# Patient Record
Sex: Male | Born: 2000 | Race: White | Hispanic: No | Marital: Single | Smoking: Never smoker
Health system: Southern US, Community
[De-identification: ages and names within clinical notes are randomized; demographics above are authoritative.]

## PROBLEM LIST (undated history)

## (undated) DIAGNOSIS — T7840XA Allergy, unspecified, initial encounter: Secondary | ICD-10-CM

## (undated) DIAGNOSIS — Z8619 Personal history of other infectious and parasitic diseases: Secondary | ICD-10-CM

## (undated) DIAGNOSIS — J45909 Unspecified asthma, uncomplicated: Secondary | ICD-10-CM

## (undated) HISTORY — DX: Personal history of other infectious and parasitic diseases: Z86.19

## (undated) HISTORY — DX: Unspecified asthma, uncomplicated: J45.909

## (undated) HISTORY — DX: Allergy, unspecified, initial encounter: T78.40XA

---
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
## 2018-05-18 ENCOUNTER — Emergency Department (HOSPITAL_BASED_OUTPATIENT_CLINIC_OR_DEPARTMENT_OTHER)
Admission: EM | Admit: 2018-05-18 | Discharge: 2018-05-18 | Disposition: A | Discharge status: Home / Self Care | Payer: BC Managed Care – PPO | Attending: Emergency Medicine | Admitting: Emergency Medicine

## 2018-05-18 ENCOUNTER — Ambulatory Visit: Payer: Self-pay

## 2018-05-18 ENCOUNTER — Other Ambulatory Visit: Payer: Self-pay

## 2018-05-18 ENCOUNTER — Encounter (HOSPITAL_BASED_OUTPATIENT_CLINIC_OR_DEPARTMENT_OTHER): Payer: Self-pay | Admitting: Emergency Medicine

## 2018-05-18 DIAGNOSIS — R55 Syncope and collapse: Secondary | ICD-10-CM | POA: Insufficient documentation

## 2018-05-18 DIAGNOSIS — Z79899 Other long term (current) drug therapy: Secondary | ICD-10-CM | POA: Diagnosis not present

## 2018-05-18 DIAGNOSIS — J45909 Unspecified asthma, uncomplicated: Secondary | ICD-10-CM | POA: Diagnosis not present

## 2018-05-18 LAB — CBG MONITORING, ED: Glucose-Capillary: 105 mg/dL — ABNORMAL HIGH (ref 70–99)

## 2018-05-18 NOTE — ED Triage Notes (Addendum)
Patient reports syncopal event after taking a shower this morning.  Reports that he had stepped out of the shower and then became dizzy and lightheaded as well as nauseated.  States that he sat down on the edge of the tub and the next thing he remembers is waking up in the tub.  Denies head injury however he does endorse mild headache.  Reports feeling better after eating and drinking this morning.  Mother reports she believes this was just related to not drinking enough over the weekend but PCP told patient to come to ER to be evaluated.

## 2018-05-18 NOTE — Discharge Instructions (Signed)
You were seen in the emergency department after passing out, otherwise known as a syncope episode.  Your EKG was normal.  Your blood sugar was normal as well.  Discussed this is possibly due to being dehydrated.  We would like you to get plenty of water and eat throughout the day today.  Be sure to rest.  We would like you to follow-up with your primary care provider in 5 to 7 days for reevaluation.  Return to the ER anytime for new or worsening symptoms including but not limited to passing out again, chest pain, trouble breathing, headache, neck pain, back pain, numbness, weakness, or any other concerns that you may have.

## 2018-05-18 NOTE — ED Provider Notes (Signed)
[facility] EMERGENCY DEPARTMENT Provider Note   CSN: [id number] Arrival date & time: 05/18/18  1011     History   Chief Complaint Chief Complaint  Patient presents with  . Loss of Consciousness    HPI [name] is a 17 y.o. male with a hx of asthma and previous syncopal episodes who presents to the ED with his mother and father s/p syncopal episode which occurred shortly prior to arrival this AM.  Patient states that he was away for the weekend doing a lot of hiking, not drinking enough water, and staying in an un-airconditioned facility, returned home last night.  He states this morning he woke up feeling okay, had not had anything to eat or drink, and took a hot shower.  He states that after stepping out of the shower he began to feel lightheaded, dizzy, and nauseous as if he may pass out.  He states that he sat down in the edge of the tub and subsequently passed out.  He believes he was only out for a very brief period of time.  He states that he woke up in the bathtub and does not think that he hit his head.  He felt fairly with it when he woke up, reports he was able to ambulate and get something to eat and drink and feel much better.  He did have a mild headache initially upon arrival to the ER, however upon my evaluation he states this is resolved.  He has no complaints at this time, he states he feels his normal self, he and family at bedside feel that this was likely due to dehydration primarily came in to the ER because PCP recommended it.  He had a similar episode a couple of years ago when he had mono and was dehydrated, he states that he feels much better after this syncopal episode in comparison to last time.  Other than eating and drinking after the incident no specific alleviating or aggravating factors.  Denies fever, chills, vomiting, diarrhea, abdominal pain, chest pain, shortness of breath, or palpitations.  Denies tea colored urine.  Denies family history of  sudden cardiac death. Denies leg pain/swelling, hemoptysis, recent surgery/trauma, recent long travel, hormone use, personal hx of cancer, or hx of DVT/PE.    HPI  Past Medical History:  Diagnosis Date  . Allergy   . Asthma   . History of chicken pox     Patient Active Problem List   Diagnosis Date Noted  . Allergic rhinitis 03/17/2017  . Syncope 12/18/2015    History reviewed. No pertinent surgical history.      Home Medications    Prior to Admission medications   Medication Sig Start Date End Date Taking? Authorizing Provider  cetirizine (ZYRTEC) 10 MG tablet Take 10 mg by mouth daily.    [provider]  fluticasone (FLONASE) 50 MCG/ACT nasal spray Place 2 sprays into both nostrils daily. Patient not taking: Reported on 03/17/2017 09/02/16   Sheliah Hatch, MD  predniSONE (DELTASONE) 10 MG tablet 3 tabs x3 days and then 2 tabs x3 days and then 1 tab x3 days.  Take w/ food. 03/17/17   Sheliah Hatch, MD    Family History Family History  Problem Relation Age of Onset  . Diabetes Father     Social History Social History   Tobacco Use  . Smoking status: Never Smoker  . Smokeless tobacco: Never Used  Substance Use Topics  . Alcohol use: No  Alcohol/week: 0.0 oz  . Drug use: No     Allergies   Patient has no known allergies.   Review of Systems Review of Systems  Constitutional: Negative for chills and fever.  Eyes: Negative for visual disturbance.  Respiratory: Negative for shortness of breath.   Cardiovascular: Negative for chest pain, palpitations and leg swelling.  Gastrointestinal: Positive for nausea (resolved at present). Negative for abdominal pain and vomiting.  Genitourinary: Negative for decreased urine volume, difficulty urinating, dysuria and urgency.       Negative for tea colored urine  Neurological: Positive for dizziness (resolved at present), syncope, light-headedness (resolved at present) and headaches (resolved at  present). Negative for weakness and numbness.  All other systems reviewed and are negative.    Physical Exam Updated Vital Signs BP 115/73 (BP Location: Right Arm)   Pulse 60   Temp 98.7 F (37.1 C) (Oral)   Resp 14   Ht 5\' 11"  (1.803 m)   Wt 72.6 kg (160 lb)   SpO2 100%   BMI 22.32 kg/m   Physical Exam  Constitutional: He appears well-developed and well-nourished.  Non-toxic appearance. No distress.  HENT:  Head: Normocephalic and atraumatic. Head is without raccoon's eyes and without Battle's sign.  Right Ear: Tympanic membrane normal. No hemotympanum.  Left Ear: Tympanic membrane normal. No hemotympanum.  Nose: Nose normal.  Mouth/Throat: Uvula is midline and oropharynx is clear and moist.  Eyes: Pupils are equal, round, and reactive to light. Conjunctivae and EOM are normal. Right eye exhibits no discharge. Left eye exhibits no discharge.  Neck: Normal range of motion. Neck supple. No spinous process tenderness present. No neck rigidity.  Cardiovascular: Normal rate, regular rhythm and intact distal pulses.  No murmur heard. Pulmonary/Chest: Breath sounds normal. No respiratory distress. He has no wheezes. He has no rales.  Abdominal: Soft. He exhibits no distension. There is no tenderness.  Musculoskeletal: He exhibits no edema or tenderness.  No obvious deformities, appreciable swelling, erythema, ecchymosis, or areas of warmth. Upper extremities: Normal range of motion, nontender Back: No midline tenderness Lower extremities: Normal range of motion.  Nontender.  Neurological:  Alert. Clear speech. No facial droop. CNIII-XII grossly intact. Bilateral upper and lower extremities' sensation grossly intact. 5/5 symmetric strength with grip strength and with plantar and dorsi flexion bilaterally. . Normal finger to nose bilaterally. Negative pronator drift. Negative Romberg sign. Gait is steady and intact.   Skin: Skin is warm and dry. No rash noted.  Psychiatric: He has a  normal mood and affect. His behavior is normal.  Nursing note and vitals reviewed.    ED Treatments / Results  Labs (all labs ordered are listed, but only abnormal results are displayed) Labs Reviewed  CBG MONITORING, ED - Abnormal; Notable for the following components:      Result Value   Glucose-Capillary 105 (*)    All other components within normal limits    EKG EKG Interpretation  Date/Time:  Monday May 18 2018 11:24:00 EDT Ventricular Rate:  75 PR Interval:    QRS Duration: 101 QT Interval:  392 QTC Calculation: 438 R Axis:   86 Text Interpretation:  Sinus rhythm Baseline wander in lead(s) V5 Confirmed by Tilden Fossaees, Elizabeth [id number]) on 05/18/2018 11:44:27 AM   Radiology No results found.  Procedures Procedures (including critical care time)  Medications Ordered in ED Medications - No data to display   Initial Impression / Assessment and Plan / ED Course  I have reviewed the triage vital signs  and the nursing notes.  Pertinent labs & imaging results that were available during my care of the patient were reviewed by me and considered in my medical decision making (see chart for details).   Patient presents to the emergency department status post syncopal episode feeling at baseline upon my evaluation with his parents at bedside.  Patient nontoxic-appearing, in no apparent distress, vitals WNL.  Exam is grossly unremarkable, no focal neurologic deficits or focal midline tenderness, no signs of serious head, neck, or back injury.  EKG with normal sinus rhythm, on cardiac monitor patient has not had any arrhythmias or tachycardia.  CBG 105.  Patient is a young otherwise healthy 17 year old male, he does not have history of heart disease, no family history of sudden cardiac death, patient is low risk.  EKG without ischemia to raise concern for ACS. Patient is PERC negative, doubt pulmonary embolism.  Urine has not been tea colored, doubt rhabdomyolysis.  Discussed option of  evaluating further with blood work including electrolytes and hemoglobin, patient and family do not feel this is necessary, I am in agreement based on history and physical exam as well as history of similar a few years ago.  Patient ambulatory without sxs in the ED, hemodynamically stable and safe for discharge. Discussed need for good hydration, eating normally throughout the day, and rest with primary care follow-up. I discussed results, treatment plan, need for PCP follow-up, and return precautions with the patient and his parents at bedside. Provided opportunity for questions, patient and his parents confirmed understanding and are in agreement with plan.   Findings and plan of care discussed with supervising physician Dr. Madilyn Hook- in agreement with plan.   Vitals:   05/18/18 1027 05/18/18 1235  BP: 115/73 124/71  Pulse: 60 88  Resp: 14 18  Temp: 98.7 F (37.1 C)   SpO2: 100% 99%   Final Clinical Impressions(s) / ED Diagnoses   Final diagnoses:  Syncope, unspecified syncope type    ED Discharge Orders    None       Maxwell Lozano 05/18/18 Derinda Sis, MD 05/19/18 [phone]

## 2018-05-18 NOTE — Telephone Encounter (Signed)
Pt's. mother called; reported the pt. fainted in the shower.  Stated he is awake, alert, and responsive at this time.  She was alerted by him, per cell phone, that he needed her help.  The pt. Able to answer Triage assessment questions.  Alert and oriented x 3.  Denied any injury with falling in shower.  Reported just prior to shower, he noticed a bruise on his penis, looked at it, and then got into the shower.  Denied any ill feeling or lightheaded feeling initially.  Stated when he was ready to get out of shower, he noticed that his vision got dark and he saw the room spinning.  He woke up on the bathtub floor.  Stated he doesn't think he was unconscious for longer than a few minutes; stated < 5 min.  His mother reported he called on his cell phone for help.  Pt. Denied any heart palpitations, chest pain, SOB, weakness of extremities. Denied hitting head.  Reported he has a slight headache at this time, and some residual lightheaded feeling that worsens with movement.  Stated his vision is clear at this time.  Mother reported skin warm/ dry.  Blood sugar checked; fasting BS 105. (reported pt. Not diabetic; checked with father's glucose monitor)  Denied any abdominal pain, nausea or vomiting.  Recommended to be seen in ER; [facility] Dr. Beverely Lowabori is out of office.  FC agreed pt. will need to be eval. In the ER.  Advised pt. and mother of recommendation to go to ER now.  Verb. Understanding.          Reason for Disposition . [1] All other patients AND [2] now alert and feels fine(Exception: SIMPLE FAINT due to stress, pain, prolonged standing, or suddenly standing)  Answer Assessment - Initial Assessment Questions 1. ONSET: "How long were you unconscious?" (minutes) "When did it happen?"     Unknown; mother  2. CONTENT: "What happened during period of unconsciousness?" (e.g., seizure activity)     Felt lightheaded just after showered 3. MENTAL STATUS: "Alert and oriented now?" (oriented  x 3 = name, month, location)      Alert and oriented at present  4. TRIGGER: "What do you think caused the fainting?" "What were you doing just before you fainted?"  (e.g., exercise, sudden standing up, prolonged standing)     unknown 5. RECURRENT SYMPTOM: "Have you ever passed out before?" If so, ask: "When was the last time?" and "What happened that time?"      About 2 years ago after diag. With Mono 6. INJURY: "Did you sustain any injury during the fall?"     Denied injury 7. CARDIAC SYMPTOMS: "Have you had any of the following symptoms: chest pain, difficulty breathing, palpitations?"    Denied the above 8. NEUROLOGIC SYMPTOMS: "Have you had any of the following symptoms: headache, numbness, vertigo, weakness?"     Light headed; denies any visual sx's. ; described previous episode of room spinning 9. GI SYMPTOMS: "Have you had any of the following symptoms: abdominal pain, vomiting, diarrhea, blood in stools?"     Denied the above  10. OTHER SYMPTOMS: "Do you have any other symptoms?"       Denied fever/ chills; c/o headache; was out in the heat over weekend  Protocols used: [facility]

## 2018-05-18 NOTE — Telephone Encounter (Signed)
Reviewed in PCP absence. Correct disposition given. Will keep an eye out for ER summary.

## 2018-05-19 ENCOUNTER — Telehealth: Payer: Self-pay | Admitting: General Practice

## 2018-05-19 ENCOUNTER — Encounter: Payer: Self-pay | Admitting: General Practice

## 2018-05-19 NOTE — Telephone Encounter (Signed)
Report ran by office showed this patient is due for their Meningitis B (Bexsero ) vaccine. Please advise if ok to schedule patient a nurse visit for this vaccination?  

## 2018-05-20 NOTE — Telephone Encounter (Signed)
Reviewing in PCP absence. No contraindications to Bexsero. Is recommended. Ok to contact patient and schedule if he is willing.

## 2018-05-20 NOTE — Telephone Encounter (Signed)
Letter mailed to pt to inform of Bexsero need.  

## 2019-01-04 ENCOUNTER — Other Ambulatory Visit: Payer: Self-pay

## 2019-01-04 ENCOUNTER — Ambulatory Visit: Payer: BC Managed Care – PPO | Admitting: Family Medicine

## 2019-01-04 ENCOUNTER — Encounter: Payer: Self-pay | Admitting: Family Medicine

## 2019-01-04 VITALS — BP 126/78 | HR 80 | Temp 98.1°F | Resp 16 | Ht 71.0 in | Wt 164.0 lb

## 2019-01-04 DIAGNOSIS — H109 Unspecified conjunctivitis: Secondary | ICD-10-CM | POA: Diagnosis not present

## 2019-01-04 MED ORDER — OFLOXACIN 0.3 % OP SOLN: 1.0000 [drp] | Freq: Four times a day (QID) | OPHTHALMIC | 0 refills | Status: DC

## 2019-01-04 NOTE — Patient Instructions (Signed)
Please follow up if symptoms do not improve or as needed.   Bacterial Conjunctivitis, Adult Bacterial conjunctivitis is an infection of your conjunctiva. This is the clear membrane that covers the white part of your eye and the inner part of your eyelid. This infection can make your eye:  Red or pink.  Itchy. This condition spreads easily from person to person (is contagious) and from one eye to the other eye. What are the causes?  This condition is caused by germs (bacteria). You may get the infection if you come into close contact with: ? A person who has the infection. ? Items that have germs on them (are contaminated), such as face towels, contact lens solution, or eye makeup. What increases the risk? You are more likely to get this condition if you:  Have contact with people who have the infection.  Wear contact lenses.  Have a sinus infection.  Have had a recent eye injury or surgery.  Have a weak body defense system (immune system).  Have dry eyes. What are the signs or symptoms?   Thick, yellowish discharge from the eye.  Tearing or watery eyes.  Itchy eyes.  Burning feeling in your eyes.  Eye redness.  Swollen eyelids.  Blurred vision. How is this treated?   Antibiotic eye drops or ointment.  Antibiotic medicine taken by mouth. This is used for infections that do not get better with drops or ointment or that last more than 10 days.  Cool, wet cloths placed on the eyes.  Artificial tears used 2-6 times a day. Follow these instructions at home: Medicines  Take or apply your antibiotic medicine as told by your doctor. Do not stop taking or applying the antibiotic even if you start to feel better.  Take or apply over-the-counter and prescription medicines only as told by your doctor.  Do not touch your eyelid with the eye-drop bottle or the ointment tube. Managing discomfort  Wipe any fluid from your eye with a warm, wet washcloth or a cotton  ball.  Place a clean, cool, wet cloth on your eye. Do this for 10-20 minutes, 3-4 times per day. General instructions  Do not wear contacts until the infection is gone. Wear glasses until your doctor says it is okay to wear contacts again.  Do not wear eye makeup until the infection is gone. Throw away old eye makeup.  Change or wash your pillowcase every day.  Do not share towels or washcloths.  Wash your hands often with soap and water. Use paper towels to dry your hands.  Do not touch or rub your eyes.  Do not drive or use heavy machinery if your vision is blurred. Contact a doctor if:  You have a fever.  You do not get better after 10 days. Get help right away if:  You have a fever and your symptoms get worse all of a sudden.  You have very bad pain when you move your eye.  Your face: ? Hurts. ? Is red. ? Is swollen.  You have sudden loss of vision. Summary  Bacterial conjunctivitis is an infection of your conjunctiva.  This infection spreads easily from person to person.  Wash your hands often with soap and water. Use paper towels to dry your hands.  Take or apply your antibiotic medicine as told by your doctor.  Contact a doctor if you have a fever or you do not get better after 10 days. This information is not intended to replace advice  given to you by your health care provider. Make sure you discuss any questions you have with your health care provider. Document Released: 07/23/2008 Document Revised: 05/20/2018 Document Reviewed: 05/20/2018 Elsevier Interactive Patient Education  2019 [employer].

## 2019-01-04 NOTE — Progress Notes (Signed)
   Subjective  CC:  Chief Complaint  Patient presents with  . Conjunctivitis    Left eye, started 01/01/18.. Red and draining   Same day acute visit; PCP not available. New pt to me. Chart reviewed.   HPI: Maxwell Lozano is a 18 y.o. male who presents to the office today to address the problems listed above in the chief complaint.  Healthy 18 year old male noticed some left eye irritative symptoms on Saturday night.  Awoke with red sore eye crusted closed.  Since, increasing redness.  No photophobia.  No right eye symptoms.  No associated URI.  No vision changes.  Has had positive exposure from friends with similar illness. Assessment  1. Bacterial conjunctivitis of left eye      Plan   Bacterial conjunctivitis: Antibiotic drops.  Education and prevention of spread discussed.  See after visit summary.  Follow up: Follow-up if unimproved   No orders of the defined types were placed in this encounter.  Meds ordered this encounter  Medications  . ofloxacin (OCUFLOX) 0.3 % ophthalmic solution    Sig: Place 1 drop into the left eye 4 (four) times daily.    Dispense:  5 mL    Refill:  0      I reviewed the patients updated PMH, FH, and SocHx.    Patient Active Problem List   Diagnosis Date Noted  . Allergic rhinitis 03/17/2017  . Syncope 12/18/2015   No outpatient medications have been marked as taking for the 01/04/19 encounter (Office Visit) with Willow Ora, MD.    Allergies: Patient has No Known Allergies. Family History: Patient family history includes Diabetes in his father. Social History:  Patient  reports that he has never smoked. He has never used smokeless tobacco. He reports that he does not drink alcohol or use drugs.  Review of Systems: Constitutional: Negative for fever malaise or anorexia Cardiovascular: negative for chest pain Respiratory: negative for SOB or persistent cough Gastrointestinal: negative for abdominal pain  Objective  Vitals: BP  126/78   Pulse 80   Temp 98.1 F (36.7 C) (Oral)   Resp 16   Ht 5\' 11"  (1.803 m)   Wt 164 lb (74.4 kg)   SpO2 98%   BMI 22.87 kg/m  General: no acute distress , A&Ox3 HEENT: PEERL, conjunctiva on left with global injection and mild chemosis, normal pupils, no photophobia, right conjunctive is normal.      Commons side effects, risks, benefits, and alternatives for medications and treatment plan prescribed today were discussed, and the patient expressed understanding of the given instructions. Patient is instructed to call or message via MyChart if he/she has any questions or concerns regarding our treatment plan. No barriers to understanding were identified. We discussed Red Flag symptoms and signs in detail. Patient expressed understanding regarding what to do in case of urgent or emergency type symptoms.   Medication list was reconciled, printed and provided to the patient in AVS. Patient instructions and summary information was reviewed with the patient as documented in the AVS. This note was prepared with assistance of Dragon voice recognition software. Occasional wrong-word or sound-a-like substitutions may have occurred due to the inherent limitations of voice recognition software

## 2019-01-11 ENCOUNTER — Telehealth: Payer: Self-pay | Admitting: Family Medicine

## 2019-01-11 MED ORDER — OFLOXACIN 0.3 % OP SOLN: 1.0000 [drp] | Freq: Four times a day (QID) | OPHTHALMIC | 0 refills | Status: DC

## 2019-01-11 NOTE — Telephone Encounter (Signed)
Mom is not on DPR, so she gave me sons number to call.    LM for patient to call - however medication was refilled to the pharmacy.

## 2019-01-11 NOTE — Telephone Encounter (Signed)
Copied from CRM #[id number]. Topic: General - Other °>> Jan 11, 2019 11:48 AM Stovall, Shana A wrote: °Reason for CRM: Pt mother called in and stated that pt was seen last week by Dr andy. He had pink eye in his left. He was given med and it cleared up but this morning he woke up and now has it in his right eye.  She would like to know if another round of drops could be called in for his right eye?   ° °Pharmacy -[id number] - [place], [place] - [address] [phone] (Phone)  °Best number  [phone] °

## 2019-01-11 NOTE — Telephone Encounter (Signed)
Copied from CRM [id number]. Topic: General - Other >> Jan 11, 2019 11:48 AM Floria Raveling A wrote: Reason for CRM: Pt mother called in and stated that pt was seen last week by Dr Mardelle Matte. He had pink eye in his left. He was given med and it cleared up but this morning he woke up and now has it in his right eye.  She would like to know if another round of drops could be called in for his right eye?    Pharmacy -[id number] - [place], [place] - [address] [phone] (Phone)  Best number  [phone]

## 2019-01-11 NOTE — Telephone Encounter (Signed)
Ok for refill on eye drops (or they can use what is left)

## 2019-01-12 NOTE — Telephone Encounter (Signed)
Called and left additional message. Encounter closed until pt returns call.

## 2019-01-28 ENCOUNTER — Ambulatory Visit (INDEPENDENT_AMBULATORY_CARE_PROVIDER_SITE_OTHER): Payer: BC Managed Care – PPO | Admitting: Family Medicine

## 2019-01-28 ENCOUNTER — Encounter: Payer: Self-pay | Admitting: Family Medicine

## 2019-01-28 ENCOUNTER — Other Ambulatory Visit: Payer: Self-pay

## 2019-01-28 VITALS — Ht 71.0 in

## 2019-01-28 DIAGNOSIS — H6690 Otitis media, unspecified, unspecified ear: Secondary | ICD-10-CM

## 2019-01-28 MED ORDER — AMOXICILLIN 875 MG PO TABS: 875.0000 mg | ORAL_TABLET | Freq: Two times a day (BID) | ORAL | 0 refills | Status: DC

## 2019-01-28 MED ORDER — FLUTICASONE PROPIONATE 50 MCG/ACT NA SUSP: 2.0000 | Freq: Every day | NASAL | 6 refills | Status: DC

## 2019-01-28 NOTE — Progress Notes (Signed)
I have discussed the procedure for the virtual visit with the patient who has given consent to proceed with assessment and treatment.   BETHANY DILLARD, CMA     

## 2019-01-28 NOTE — Progress Notes (Signed)
   Virtual Visit via Video   I connected with@ on 01/28/19 at  3:00 PM EDT by a video enabled telemedicine application and verified that I am speaking with the correct person using two identifiers. Location patient: Home Location provider: [facility], Office Persons participating in the virtual visit: pt and me  I discussed the limitations of evaluation and management by telemedicine and the availability of in person appointments. The patient expressed understanding and agreed to proceed.  Subjective:   HPI:  R ear pain- sxs started 4 days ago.  + throbbing.  'it sounds like i'm underwater'.  Has sharp pop w/ yawn or pressure changes and then would immediately return to muffled hearing.  No fever.  No drainage.  Minimal nasal congestion.  No cough or sneezing.  No sinus pain/pressure.  No hx of ear infections.    ROS: See pertinent positives and negatives per HPI.  Patient Active Problem List   Diagnosis Date Noted  . Allergic rhinitis 03/17/2017  . Syncope 12/18/2015    Social History   Tobacco Use  . Smoking status: Never Smoker  . Smokeless tobacco: Never Used  Substance Use Topics  . Alcohol use: No    Alcohol/week: 0.0 standard drinks    Current Outpatient Medications:  .  ofloxacin (OCUFLOX) 0.3 % ophthalmic solution, Place 1 drop into the left eye 4 (four) times daily., Disp: 5 mL, Rfl: 0  No Known Allergies  Objective:   Ht 5\' 11"  (1.803 m)   BMI 22.87 kg/m  AAOx3, NAD NCAT, EOMI No obvious CN deficits Coloring WNL Pt is able to speak clearly, coherently without shortness of breath or increased work of breathing.  Thought process is linear.  Mood is appropriate.   Assessment and Plan:   Otitis Media- based on symptomology, pt has L acute otitis media.  Start Amox.  Add flonase for possible eustachian tube dysfxn.  Reviewed supportive care and red flags that should prompt return.  Pt expressed understanding and is in agreement w/ plan.     Maxwell Rhymes, MD 01/28/2019

## 2019-02-12 ENCOUNTER — Ambulatory Visit: Payer: BC Managed Care – PPO | Admitting: Physician Assistant

## 2019-05-31 ENCOUNTER — Telehealth: Payer: Self-pay | Admitting: Physician Assistant

## 2019-05-31 ENCOUNTER — Encounter: Payer: Self-pay | Admitting: Physician Assistant

## 2019-05-31 ENCOUNTER — Other Ambulatory Visit: Payer: Self-pay

## 2019-05-31 ENCOUNTER — Ambulatory Visit (INDEPENDENT_AMBULATORY_CARE_PROVIDER_SITE_OTHER): Payer: BC Managed Care – PPO | Admitting: Physician Assistant

## 2019-05-31 VITALS — Temp 98.3°F | Wt 157.0 lb

## 2019-05-31 DIAGNOSIS — H6981 Other specified disorders of Eustachian tube, right ear: Secondary | ICD-10-CM | POA: Diagnosis not present

## 2019-05-31 MED ORDER — CLARITIN-D 24 HOUR 10-240 MG PO TB24: 1.0000 | ORAL_TABLET | Freq: Every day | ORAL | 0 refills | Status: AC

## 2019-05-31 MED ORDER — FLUTICASONE PROPIONATE 50 MCG/ACT NA SUSP: 2.0000 | Freq: Every day | NASAL | 6 refills | Status: AC

## 2019-05-31 NOTE — Progress Notes (Signed)
I have discussed the procedure for the virtual visit with the patient who has given consent to proceed with assessment and treatment.   Adely Facer S Aubrii Sharpless, CMA     

## 2019-05-31 NOTE — Telephone Encounter (Signed)
Appointment today with Elyn Aquas, PA-C

## 2019-05-31 NOTE — Progress Notes (Signed)
   Virtual Visit via Video   I connected with patient on 05/31/19 at 11:30 AM EDT by a video enabled telemedicine application and verified that I am speaking with the correct person using two identifiers.  Location patient: Home Location provider: [facility] Persons participating in the virtual visit: Patient, Provider, Jerome (Patina Moore)  I discussed the limitations of evaluation and management by telemedicine and the availability of in person appointments. The patient expressed understanding and agreed to proceed.  Subjective:   HPI:  Patient presents via [employer] with c/o ongoing issues with R ear. Notes symptoms initially started in 11/2018. Notes starting with intermittent dull aching pain with ear pressure and popping.  Was seen 01/28/2019 by PCP via video visit at which time he was diagnosed with AOM and ETD. Given Rx Flonase and Amoxicillin. He only took antibiotic and notes no significant change in symptoms. States it is his R ear only. Some occasional muffled hearing and tinnitus noted. Denies dizziness. Has history of allergic rhinitis. Not taking anything for symptoms. Denies fever, chills, sinus pressure or sinus pain.  ROS:   See pertinent positives and negatives per HPI.  Patient Active Problem List   Diagnosis Date Noted  . Allergic rhinitis 03/17/2017  . Syncope 12/18/2015    Social History   Tobacco Use  . Smoking status: Never Smoker  . Smokeless tobacco: Never Used  Substance Use Topics  . Alcohol use: No    Alcohol/week: 0.0 standard drinks    Current Outpatient Medications:  .  fluticasone (FLONASE) 50 MCG/ACT nasal spray, Place 2 sprays into both nostrils daily., Disp: 16 g, Rfl: 6 .  loratadine-pseudoephedrine (CLARITIN-D 24 HOUR) 10-240 MG 24 hr tablet, Take 1 tablet by mouth daily., Disp: 20 tablet, Rfl: 0  No Known Allergies  Objective:   Temp 98.3 F (36.8 C) (Oral)   Wt 157 lb (71.2 kg)   BMI 21.90 kg/m   Patient is  well-developed, well-nourished in no acute distress.  Resting comfortably at home.  Head is normocephalic, atraumatic.  No labored breathing.  Speech is clear and coherent with logical contest.  Patient is alert and oriented at baseline.   Assessment and Plan:   1. Eustachian tube dysfunction, right Start daily Flonase and Claritin-D. Follow-up 1 week. If not improving/resolving will get him in-office for assessment. May need ENT referral.  - fluticasone (FLONASE) 50 MCG/ACT nasal spray; Place 2 sprays into both nostrils daily.  Dispense: 16 g; Refill: 6 - loratadine-pseudoephedrine (CLARITIN-D 24 HOUR) 10-240 MG 24 hr tablet; Take 1 tablet by mouth daily.  Dispense: 20 tablet; Refill: 0    Leeanne Rio, [place] 05/31/2019

## 2019-05-31 NOTE — Telephone Encounter (Signed)
Mom wanted Maxwell Lozano to know that he leaves for college on Wednesday. She wanted to make sure that if anything is giving to pt that it doesn't make him accessible to anything else. He is a VV for 1130 today.

## 2019-10-18 ENCOUNTER — Other Ambulatory Visit: Payer: Self-pay

## 2019-10-18 ENCOUNTER — Encounter: Payer: Self-pay | Admitting: Family Medicine

## 2019-10-18 ENCOUNTER — Ambulatory Visit: Payer: BC Managed Care – PPO | Admitting: Family Medicine

## 2019-10-18 VITALS — BP 124/82 | HR 98 | Temp 98.1°F | Resp 16 | Ht 71.0 in | Wt 175.1 lb

## 2019-10-18 DIAGNOSIS — R000 Tachycardia, unspecified: Secondary | ICD-10-CM

## 2019-10-18 LAB — BASIC METABOLIC PANEL
BUN: 10 mg/dL (ref 6–23)
CO2: 27 mEq/L (ref 19–32)
Calcium: 9.6 mg/dL (ref 8.4–10.5)
Chloride: 102 mEq/L (ref 96–112)
Creatinine, Ser: 0.88 mg/dL (ref 0.40–1.50)
GFR: 109.49 mL/min (ref >60.00)
Glucose, Bld: 76 mg/dL (ref 70–99)
Potassium: 3.6 mEq/L (ref 3.5–5.1)
Sodium: 138 mEq/L (ref 135–145)

## 2019-10-18 LAB — CBC WITH DIFFERENTIAL/PLATELET
Basophils Absolute: 0 10*3/uL (ref 0.0–0.1)
Basophils Relative: 0.5 % (ref 0.0–3.0)
Eosinophils Absolute: 0 10*3/uL (ref 0.0–0.7)
Eosinophils Relative: 0.6 % (ref 0.0–5.0)
HCT: 46.2 % (ref 39.0–52.0)
Hemoglobin: 15.8 g/dL (ref 13.0–17.0)
Lymphocytes Relative: 24.9 % (ref 12.0–46.0)
Lymphs Abs: 1.8 10*3/uL (ref 0.7–4.0)
MCHC: 34.1 g/dL (ref 30.0–36.0)
MCV: 91.4 fl (ref 78.0–100.0)
Monocytes Absolute: 0.4 10*3/uL (ref 0.1–1.0)
Monocytes Relative: 5.9 % (ref 3.0–12.0)
Neutro Abs: 5 10*3/uL (ref 1.4–7.7)
Neutrophils Relative %: 68.1 % (ref 43.0–77.0)
Platelets: 125 10*3/uL — ABNORMAL LOW (ref 150.0–400.0)
RBC: 5.05 Mil/uL (ref 4.22–5.81)
RDW: 12.9 % (ref 11.5–14.6)
WBC: 7.4 10*3/uL (ref 4.5–10.5)

## 2019-10-18 LAB — TSH: TSH: 0.98 u[IU]/mL (ref 0.35–5.50)

## 2019-10-18 NOTE — Patient Instructions (Addendum)
We'll notify you of your lab results and make any changes if needed EKG looks great! If the labs are normal, we'll go ahead and order that holter monitor Try and take the Sertraline daily- sometimes skipping pills can cause withdrawal symptoms (including tachycardia) Limit caffeine intake Increase your water intake Call with any questions or concerns Hang in there!!

## 2019-10-18 NOTE — Progress Notes (Addendum)
   Subjective:    Patient ID: Maxwell Lozano, male    DOB: 2001/09/19, 18 y.o.   MRN: [id number]  HPI Tachycardia- had COVID beginning of September and developed increased HR late Sept/early Oct.  Continues to have issues w/ increased HR.  Reports he can be lying in bed and 'feel a rush of adrenaline'.  HR will jump to 120s just resting.  No particular time of day.  Increased anxiety recently.  Has started Sertraline recently- 'not super good about taking it'.  Will have associated SOB at times.  Pt reports HR 'is a separate thing from a panic attack'.  No hx of similar.  Pt reports sxs will occur ~4x/week and last for a few minutes at a time.   Review of Systems For ROS see HPI   This visit occurred during the SARS-CoV-2 public health emergency.  Safety protocols were in place, including screening questions prior to the visit, additional usage of staff PPE, and extensive cleaning of exam room while observing appropriate contact time as indicated for disinfecting solutions.       Objective:   Physical Exam Vitals reviewed.  Constitutional:      General: He is not in acute distress.    Appearance: Normal appearance. He is well-developed.  HENT:     Head: Normocephalic and atraumatic.  Eyes:     Conjunctiva/sclera: Conjunctivae normal.     Pupils: Pupils are equal, round, and reactive to light.  Neck:     Thyroid: No thyromegaly.  Cardiovascular:     Rate and Rhythm: Normal rate and regular rhythm.     Heart sounds: Normal heart sounds. No murmur.  Pulmonary:     Effort: Pulmonary effort is normal. No respiratory distress.     Breath sounds: Normal breath sounds.  Abdominal:     General: Bowel sounds are normal. There is no distension.     Palpations: Abdomen is soft.  Musculoskeletal:     Cervical back: Normal range of motion and neck supple.  Lymphadenopathy:     Cervical: No cervical adenopathy.  Skin:    General: Skin is warm and dry.  Neurological:     Mental Status: He is  alert and oriented to person, place, and time.     Cranial Nerves: No cranial nerve deficit.  Psychiatric:        Behavior: Behavior normal.           Assessment & Plan:  Tachycardia- new.  Pt reports sxs started shortly after COVID dx.  May have thyroiditis s/p COVID but there are other factors involved.  He was recently started on Sertraline and admits that he doesn't take this regularly.  This can impact his tachycardia and/or palpitations.  EKG WNL today- HR 76, sinus rhythm.  Need to check TSH, CBC, BMP.  If no abnormalities, will get holter to assess.  Encouraged daily use of SSRI.  Will follow closely.

## 2019-10-19 ENCOUNTER — Other Ambulatory Visit: Payer: Self-pay | Admitting: Family Medicine

## 2019-10-19 DIAGNOSIS — R000 Tachycardia, unspecified: Secondary | ICD-10-CM

## 2019-10-19 NOTE — Progress Notes (Signed)
Called pt and lmovm to return call.

## 2019-10-20 ENCOUNTER — Telehealth: Payer: Self-pay | Admitting: Radiology

## 2019-10-20 NOTE — Telephone Encounter (Signed)
Enrolled patient for a 3 day Zio monitor to be mailed to patients home. Brief instructions were left on voicemail.   48hr holter was changed to a 3 day Zio for mailing purposes during covid.

## 2019-10-25 ENCOUNTER — Other Ambulatory Visit (INDEPENDENT_AMBULATORY_CARE_PROVIDER_SITE_OTHER): Payer: BC Managed Care – PPO

## 2019-10-25 DIAGNOSIS — R000 Tachycardia, unspecified: Secondary | ICD-10-CM | POA: Diagnosis not present

## 2019-11-16 ENCOUNTER — Other Ambulatory Visit: Payer: Self-pay | Admitting: Family Medicine

## 2019-11-16 DIAGNOSIS — R000 Tachycardia, unspecified: Secondary | ICD-10-CM

## 2020-04-05 ENCOUNTER — Encounter: Payer: Self-pay | Admitting: Family Medicine

## 2020-04-14 ENCOUNTER — Ambulatory Visit: Payer: BC Managed Care – PPO | Admitting: Family Medicine

## 2020-04-14 ENCOUNTER — Encounter: Payer: Self-pay | Admitting: Family Medicine

## 2020-04-14 ENCOUNTER — Other Ambulatory Visit: Payer: Self-pay

## 2020-04-14 VITALS — BP 108/72 | HR 63 | Temp 97.9°F | Resp 16 | Ht 71.0 in | Wt 174.0 lb

## 2020-04-14 DIAGNOSIS — R000 Tachycardia, unspecified: Secondary | ICD-10-CM

## 2020-04-14 DIAGNOSIS — H6981 Other specified disorders of Eustachian tube, right ear: Secondary | ICD-10-CM

## 2020-04-14 NOTE — Patient Instructions (Signed)
Follow up as needed or as scheduled We'll notify you of your lab results and make any changes if needed Restart daily allergy medication (claritin or zyrtec) or Flonase to improve the ear congestion and decreased hearing We will call you with your cardiology appt to assess the racing heartbeat Avoid caffeine, try and be mindful of stress management Call with any questions or concerns Hang in there!

## 2020-04-14 NOTE — Addendum Note (Signed)
Addended by: Sheliah Hatch on: 04/14/2020 01:50 PM   Modules accepted: Orders

## 2020-04-14 NOTE — Progress Notes (Signed)
Subjective:    Patient ID: Anabel Halon, male    DOB: [birth year], 19 y.o.   MRN: [id number]  HPI Decreased hearing- sxs started ~1 yr ago.  'always the R ear.  Feels like there's impacted wax'.  Will occur for 1-2 weeks and then disappear for 'a couple of weeks'.  No relief w/ peroxide, Qtips.  Today is able to hear things clearly.  Pt has sensation of needing ear to 'pop'.  Not currently taking allergy medications.  Palpitations- pt previously wore a holter monitor but there were no captured events.  Pt reports 'out of nowhere my heart rate will spike and i'll feel a rush of adrenaline'.  Pt reports these episodes are unprovoked- not related to caffeine or particular trigger.  Last episode occurred when driving to work on Tuesday.  Pt reports he will often get sweaty, hot, flushed.  Pt reports he did have a 'sharp pain in my heart' that lasted for ~10-15 seconds.  Afterwards had 'lingering pins and needles' in L hand for ~2 days.  Pt reports stress level has improved.     Review of Systems For ROS see HPI   This visit occurred during the SARS-CoV-2 public health emergency.  Safety protocols were in place, including screening questions prior to the visit, additional usage of staff PPE, and extensive cleaning of exam room while observing appropriate contact time as indicated for disinfecting solutions.       Objective:   Physical Exam Vitals reviewed.  Constitutional:      General: He is not in acute distress.    Appearance: Normal appearance. He is well-developed.  HENT:     Head: Normocephalic and atraumatic.     Right Ear: Tympanic membrane and ear canal normal. There is no impacted cerumen.     Left Ear: Tympanic membrane and ear canal normal. There is no impacted cerumen.  Eyes:     Conjunctiva/sclera: Conjunctivae normal.     Pupils: Pupils are equal, round, and reactive to light.  Neck:     Thyroid: No thyromegaly.  Cardiovascular:     Rate and Rhythm: Normal rate and regular  rhythm.     Heart sounds: Normal heart sounds. No murmur heard.   Pulmonary:     Effort: Pulmonary effort is normal. No respiratory distress.     Breath sounds: Normal breath sounds.  Abdominal:     General: Bowel sounds are normal. There is no distension.     Palpations: Abdomen is soft.  Musculoskeletal:     Cervical back: Normal range of motion and neck supple.  Lymphadenopathy:     Cervical: No cervical adenopathy.  Skin:    General: Skin is warm and dry.  Neurological:     Mental Status: He is alert and oriented to person, place, and time.     Cranial Nerves: No cranial nerve deficit.  Psychiatric:        Behavior: Behavior normal.           Assessment & Plan:  Eustachian tube dysfxn- recurrent issue for pt.  No evidence of cerumen impaction on exam today.  Reviewed dx and tx of eustachian tube dysfxn w/ pt.  He will restart allergy medication to improve congestion and improve sxs.  Tachycardia- pt reports he continues to have unprovoked sxs that he describes as a 'rush of adrenaline'.  Will get flushed, hot, sweaty.  He is not able to check BP during these episodes to know if he is also hypertensive.  Will check plasma catecholamines and metanephrines and will also refer to Cardiology in case labs are normal.  Reviewed supportive care and red flags that should prompt return.  Pt expressed understanding and is in agreement w/ plan.

## 2020-04-20 LAB — CATECHOLAMINES, FRACTIONATED, PLASMA
Dopamine: <10 pg/mL
Epinephrine: 48 pg/mL
Norepinephrine: 273 pg/mL
Total Catecholamines: 321 pg/mL

## 2020-04-20 LAB — METANEPHRINES, PLASMA
Metanephrine, Free: 43 pg/mL (ref <=57)
Normetanephrine, Free: 30 pg/mL (ref <=148)
Total Metanephrines-Plasma: 73 pg/mL (ref <=205)

## 2020-05-12 ENCOUNTER — Ambulatory Visit: Payer: BC Managed Care – PPO | Admitting: Cardiology

## 2020-05-12 ENCOUNTER — Encounter: Payer: Self-pay | Admitting: Cardiology

## 2020-05-12 ENCOUNTER — Other Ambulatory Visit: Payer: Self-pay

## 2020-05-12 VITALS — BP 117/72 | HR 67 | Temp 97.2°F | Ht 70.0 in | Wt 170.8 lb

## 2020-05-12 DIAGNOSIS — R000 Tachycardia, unspecified: Secondary | ICD-10-CM

## 2020-05-12 MED ORDER — PROPRANOLOL HCL 10 MG PO TABS: ORAL_TABLET | ORAL | 2 refills | Status: AC

## 2020-05-12 NOTE — Progress Notes (Signed)
Cardiology Office Note   Date:  05/12/2020   ID:  Maxwell Lozano, DOB [birth year], MRN [id number]  PCP:  Sheliah Hatch, MD  Cardiologist:   Rollene Rotunda, MD Referring:  Sheliah Hatch, MD   Chief Complaint  Patient presents with  . Palpitations      History of Present Illness: Maxwell Lozano is a 19 y.o. male who presents for evaluation of palpitations.    He was recently noted to have tachycardia with flushing.  This happens starting in the fall.  He had Covid in September.  It was a somewhat mild case.  However, following this he started noticing episodes where he felt like adrenaline in his chest.  He would be sitting and he would feel a rush and his heart rate going.  He was able to take his heart rate on his watch and it was 100 2230 and might last for about 5 minutes.  Was happening 1 time per week or so.  He thought maybe it was stress and anxiety associated with school.  Is less frequent now that he is on break.  He would feel his heart pounding.  He feels lightheaded.  He might be flushed.  He could not bring it on.  It would go away spontaneously.  I think his primary care became more concerned because he did have some left arm discomfort and tingling into his left hand.  She checked VMA and metanephrines were drawn.  These were normal.  He wore a monitor but did not have any spells during that time in January and had no arrhythmias noted with that.  He has had otherwise no cardiac history.  He has no chest pressure, neck or arm discomfort.  He does not have any shortness of breath, PND or orthopnea.  He said no weight gain or edema.  He is not exercising routinely but he does stairs and walks across campus.  Past Medical History:  Diagnosis Date  . Allergy   . Asthma   . History of chicken pox     History reviewed. No pertinent surgical history.   Current Outpatient Medications  Medication Sig Dispense Refill  . fluticasone (FLONASE) 50 MCG/ACT nasal spray  Place 2 sprays into both nostrils daily. 16 g 6  . loratadine-pseudoephedrine (CLARITIN-D 24 HOUR) 10-240 MG 24 hr tablet Take 1 tablet by mouth daily. 20 tablet 0  . propranolol (INDERAL) 10 MG tablet Take 1 tablet every 8 hours as needed. 30 tablet 2   No current facility-administered medications for this visit.    Allergies:   Patient has no known allergies.    Social History:  The patient  reports that he has never smoked. He has never used smokeless tobacco. He reports that he does not drink alcohol and does not use drugs.   Family History:  The patient's family history includes Diabetes in his father.    ROS:  Please see the history of present illness.   Otherwise, review of systems are positive for none.   All other systems are reviewed and negative.    PHYSICAL EXAM: VS:  BP 117/72   Pulse 67   Temp (!) 97.2 F (36.2 C)   Ht 5\' 10"  (1.778 m)   Wt 170 lb 12.8 oz (77.5 kg)   SpO2 97%   BMI 24.51 kg/m  , BMI Body mass index is 24.51 kg/m. GENERAL:  Well appearing HEENT:  Pupils equal round and reactive, fundi not visualized, oral mucosa unremarkable  NECK:  No jugular venous distention, waveform within normal limits, carotid upstroke brisk and symmetric, no bruits, no thyromegaly LYMPHATICS:  No cervical, inguinal adenopathy LUNGS:  Clear to auscultation bilaterally BACK:  No CVA tenderness CHEST:  Unremarkable HEART:  PMI not displaced or sustained,S1 and S2 within normal limits, no S3, no S4, no clicks, no rubs, no murmurs ABD:  Flat, positive bowel sounds normal in frequency in pitch, no bruits, no rebound, no guarding, no midline pulsatile mass, no hepatomegaly, no splenomegaly EXT:  2 plus pulses throughout, no edema, no cyanosis no clubbing SKIN:  No rashes no nodules NEURO:  Cranial nerves II through XII grossly intact, motor grossly intact throughout PSYCH:  Cognitively intact, oriented to person place and time    EKG:  EKG is ordered today. The ekg ordered  today demonstrates sinus rhythm, rate 67, axis within normal limits, intervals within normal limits, no acute ST-T wave changes.   Recent Labs: 10/18/2019: BUN 10; Creatinine, Ser 0.88; Hemoglobin 15.8; Platelets 125.0; Potassium 3.6; Sodium 138; TSH 0.98    Lipid Panel No results found for: CHOL, TRIG, HDL, CHOLHDL, VLDL, LDLCALC, LDLDIRECT    Wt Readings from Last 3 Encounters:  05/12/20 170 lb 12.8 oz (77.5 kg)  04/14/20 174 lb (78.9 kg)  10/18/19 175 lb 2 oz (79.4 kg)      Other studies Reviewed: Additional studies/ records that were reviewed today include: Event monitor and labs. Review of the above records demonstrates:  Please see elsewhere in the note.     ASSESSMENT AND PLAN: Monday or something I am okay with that  TACHYCARDIA:   The patient's tachycardia might be related to stress and anxiety though I cannot exclude another mechanism.  He is going to get an Alive Cor and send results to me if he has any further episodes.  He had normal TSH, electrolytes, VMA and metanephrines.  I do not suspect a structurally abnormal heart.  I do not think this is a post Covid phenomenon.  Further management can be based on these results.  I did give him a low dose of propranolol to take as needed.   COVID EDUCATION:   He has had Covid.  Getting the vaccine.  Current medicines are reviewed at length with the patient today.  The patient does not have concerns regarding medicines.  The following changes have been made:  no change  Labs/ tests ordered today include:   Orders Placed This Encounter  Procedures  . EKG 12-Lead     Disposition:   FU with me as needed.     Signed, Rollene Rotunda, MD  05/12/2020 1:05 PM    [facility]

## 2020-05-12 NOTE — Patient Instructions (Signed)
Medication Instructions:  Start Propranolol 10 mg every 8 hours as needed.  *If you need a refill on your cardiac medications before your next appointment, please call your pharmacy*  Testing/Procedures: Try and sign up for AliveCor-    Follow-Up: At [facility], you and your health needs are our priority.  As part of our continuing mission to provide you with exceptional heart care, we have created designated Provider Care Teams.  These Care Teams include your primary Cardiologist (physician) and Advanced Practice Providers (APPs -  Physician Assistants and Nurse Practitioners) who all work together to provide you with the care you need, when you need it.  We recommend signing up for the patient portal called "MyChart".  Sign up information is provided on this After Visit Summary.  MyChart is used to connect with patients for Virtual Visits (Telemedicine).  Patients are able to view lab/test results, encounter notes, upcoming appointments, etc.  Non-urgent messages can be sent to your provider as well.   To learn more about what you can do with MyChart, go to [email or web address].    Your next appointment:   As needed  The format for your next appointment:   In Person  Provider:   Rollene Rotunda, MD

## 2021-04-25 ENCOUNTER — Encounter: Payer: Self-pay | Admitting: *Deleted

## 2022-06-22 ENCOUNTER — Encounter: Payer: Self-pay | Admitting: Family Medicine
